# Patient Record
Sex: Male | Born: 1990 | Race: White | Hispanic: No | Marital: Single | Smoking: Never smoker
Health system: Southern US, Community
[De-identification: ages and names within clinical notes are randomized; demographics above are authoritative.]

---

## 2014-10-26 ENCOUNTER — Emergency Department (HOSPITAL_COMMUNITY): Payer: Worker's Compensation

## 2014-10-26 ENCOUNTER — Emergency Department (HOSPITAL_COMMUNITY)
Admission: EM | Admit: 2014-10-26 | Discharge: 2014-10-26 | Disposition: A | Discharge status: Home / Self Care | Payer: Worker's Compensation | Attending: Emergency Medicine | Admitting: Emergency Medicine

## 2014-10-26 ENCOUNTER — Encounter (HOSPITAL_COMMUNITY): Payer: Self-pay

## 2014-10-26 DIAGNOSIS — S3991XA Unspecified injury of abdomen, initial encounter: Secondary | ICD-10-CM | POA: Diagnosis not present

## 2014-10-26 DIAGNOSIS — S3992XA Unspecified injury of lower back, initial encounter: Secondary | ICD-10-CM | POA: Insufficient documentation

## 2014-10-26 DIAGNOSIS — Y9389 Activity, other specified: Secondary | ICD-10-CM | POA: Diagnosis not present

## 2014-10-26 DIAGNOSIS — Y998 Other external cause status: Secondary | ICD-10-CM | POA: Insufficient documentation

## 2014-10-26 DIAGNOSIS — R110 Nausea: Secondary | ICD-10-CM | POA: Insufficient documentation

## 2014-10-26 DIAGNOSIS — R000 Tachycardia, unspecified: Secondary | ICD-10-CM | POA: Insufficient documentation

## 2014-10-26 DIAGNOSIS — T1490XA Injury, unspecified, initial encounter: Secondary | ICD-10-CM

## 2014-10-26 DIAGNOSIS — Y92481 Parking lot as the place of occurrence of the external cause: Secondary | ICD-10-CM | POA: Insufficient documentation

## 2014-10-26 LAB — COMPREHENSIVE METABOLIC PANEL
ALBUMIN: 4.4 g/dL (ref 3.5–5.0)
ALT: 31 U/L (ref 17–63)
AST: 25 U/L (ref 15–41)
Alkaline Phosphatase: 65 U/L (ref 38–126)
Anion gap: 8 (ref 5–15)
BUN: 7 mg/dL (ref 6–20)
CHLORIDE: 102 mmol/L (ref 101–111)
CO2: 28 mmol/L (ref 22–32)
CREATININE: 1.05 mg/dL (ref 0.61–1.24)
Calcium: 9.1 mg/dL (ref 8.9–10.3)
GFR calc Af Amer: >60 mL/min (ref >60)
GFR calc non Af Amer: >60 mL/min (ref >60)
Glucose, Bld: 98 mg/dL (ref 65–99)
POTASSIUM: 3.7 mmol/L (ref 3.5–5.1)
SODIUM: 138 mmol/L (ref 135–145)
Total Bilirubin: 0.8 mg/dL (ref 0.3–1.2)
Total Protein: 7.1 g/dL (ref 6.5–8.1)

## 2014-10-26 LAB — LIPASE, BLOOD: LIPASE: 30 U/L (ref 22–51)

## 2014-10-26 LAB — PROTIME-INR
INR: 1.05 (ref 0.00–1.49)
Prothrombin Time: 13.9 seconds (ref 11.6–15.2)

## 2014-10-26 LAB — CBC
HEMATOCRIT: 49.1 % (ref 39.0–52.0)
Hemoglobin: 17.7 g/dL — ABNORMAL HIGH (ref 13.0–17.0)
MCH: 30.2 pg (ref 26.0–34.0)
MCHC: 36 g/dL (ref 30.0–36.0)
MCV: 83.6 fL (ref 78.0–100.0)
PLATELETS: 351 10*3/uL (ref 150–400)
RBC: 5.87 MIL/uL — ABNORMAL HIGH (ref 4.22–5.81)
RDW: 12.7 % (ref 11.5–15.5)
WBC: 10 10*3/uL (ref 4.0–10.5)

## 2014-10-26 LAB — CDS SEROLOGY

## 2014-10-26 LAB — ETHANOL: Alcohol, Ethyl (B): <5 mg/dL (ref <5)

## 2014-10-26 LAB — SAMPLE TO BLOOD BANK

## 2014-10-26 MED ORDER — HYDROCODONE-ACETAMINOPHEN 5-325 MG PO TABS: 2.0000 | ORAL_TABLET | ORAL | Status: AC | PRN

## 2014-10-26 MED ORDER — IOHEXOL 300 MG/ML  SOLN
100.0000 mL | Freq: Once | INTRAMUSCULAR | Status: AC | PRN
  Administered 2014-10-26: 100 mL via INTRAVENOUS

## 2014-10-26 MED ORDER — ONDANSETRON HCL 4 MG/2ML IJ SOLN: 4.0000 mg | Freq: Once | INTRAMUSCULAR | Status: DC

## 2014-10-26 NOTE — ED Notes (Signed)
Patient returned from Xray and CT. 

## 2014-10-26 NOTE — ED Notes (Signed)
Phlebotomy at the bedside  
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
## 2014-10-26 NOTE — ED Notes (Signed)
Harley, NT at the bedside drawing blood.
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
## 2014-10-26 NOTE — ED Notes (Signed)
Resident at the bedside
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
